# Patient Record
Sex: Male | Born: 1960 | Race: White | Hispanic: No | Smoking: Current every day smoker
Health system: Southern US, Community
[De-identification: ages and names within clinical notes are randomized; demographics above are authoritative.]

## PROBLEM LIST (undated history)

## (undated) DIAGNOSIS — R39198 Other difficulties with micturition: Secondary | ICD-10-CM

## (undated) DIAGNOSIS — F32A Depression, unspecified: Secondary | ICD-10-CM

## (undated) DIAGNOSIS — F329 Major depressive disorder, single episode, unspecified: Secondary | ICD-10-CM

## (undated) HISTORY — DX: Depression, unspecified: F32.A

## (undated) HISTORY — PX: NO PAST SURGERIES: SHX2092

## (undated) HISTORY — PX: OTHER SURGICAL HISTORY: SHX169

## (undated) HISTORY — DX: Major depressive disorder, single episode, unspecified: F32.9

---

## 2001-11-07 ENCOUNTER — Emergency Department (HOSPITAL_COMMUNITY): Admission: AC | Admit: 2001-11-07 | Discharge: 2001-11-07 | Payer: Self-pay

## 2001-11-07 ENCOUNTER — Encounter: Payer: Self-pay | Admitting: Emergency Medicine

## 2008-11-19 ENCOUNTER — Emergency Department: Payer: Self-pay | Admitting: Emergency Medicine

## 2010-05-25 ENCOUNTER — Emergency Department (HOSPITAL_COMMUNITY): Admission: EM | Admit: 2010-05-25 | Discharge: 2010-05-25 | Payer: Self-pay | Admitting: Emergency Medicine

## 2014-08-08 ENCOUNTER — Emergency Department: Payer: Self-pay | Admitting: Emergency Medicine

## 2014-08-08 LAB — URINALYSIS, COMPLETE
Bacteria: NONE SEEN
Bilirubin,UR: NEGATIVE
Blood: NEGATIVE
Glucose,UR: NEGATIVE mg/dL (ref 0–75)
Ketone: NEGATIVE
Leukocyte Esterase: NEGATIVE
Nitrite: NEGATIVE
Ph: 7 (ref 4.5–8.0)
Protein: NEGATIVE
RBC,UR: 1 /HPF (ref 0–5)
Specific Gravity: 1.021 (ref 1.003–1.030)
Squamous Epithelial: <1
WBC UR: <1 /HPF (ref 0–5)

## 2014-08-08 LAB — CBC WITH DIFFERENTIAL/PLATELET
Basophil #: 0.1 10*3/uL (ref 0.0–0.1)
Basophil %: 0.7 %
Eosinophil #: 0.1 10*3/uL (ref 0.0–0.7)
Eosinophil %: 0.7 %
HCT: 49 % (ref 40.0–52.0)
HGB: 16.1 g/dL (ref 13.0–18.0)
Lymphocyte #: 1.5 10*3/uL (ref 1.0–3.6)
Lymphocyte %: 12.1 %
MCH: 29.3 pg (ref 26.0–34.0)
MCHC: 33 g/dL (ref 32.0–36.0)
MCV: 89 fL (ref 80–100)
Monocyte #: 1 x10 3/mm (ref 0.2–1.0)
Monocyte %: 8.1 %
Neutrophil #: 9.6 10*3/uL — ABNORMAL HIGH (ref 1.4–6.5)
Neutrophil %: 78.4 %
Platelet: 170 10*3/uL (ref 150–440)
RBC: 5.51 10*6/uL (ref 4.40–5.90)
RDW: 13 % (ref 11.5–14.5)
WBC: 12.3 10*3/uL — ABNORMAL HIGH (ref 3.8–10.6)

## 2014-08-08 LAB — COMPREHENSIVE METABOLIC PANEL
Albumin: 3.8 g/dL (ref 3.4–5.0)
Alkaline Phosphatase: 72 U/L
Anion Gap: 6 — ABNORMAL LOW (ref 7–16)
BUN: 17 mg/dL (ref 7–18)
Bilirubin,Total: 0.8 mg/dL (ref 0.2–1.0)
Calcium, Total: 9.2 mg/dL (ref 8.5–10.1)
Chloride: 105 mmol/L (ref 98–107)
Co2: 25 mmol/L (ref 21–32)
Creatinine: 1.09 mg/dL (ref 0.60–1.30)
EGFR (African American): >60
EGFR (Non-African Amer.): >60
Glucose: 126 mg/dL — ABNORMAL HIGH (ref 65–99)
Osmolality: 275 (ref 275–301)
Potassium: 4.3 mmol/L (ref 3.5–5.1)
SGOT(AST): 16 U/L (ref 15–37)
SGPT (ALT): 20 U/L
Sodium: 136 mmol/L (ref 136–145)
Total Protein: 8 g/dL (ref 6.4–8.2)

## 2014-11-22 ENCOUNTER — Emergency Department: Payer: Self-pay | Admitting: Internal Medicine

## 2015-04-25 ENCOUNTER — Emergency Department
Admission: EM | Admit: 2015-04-25 | Discharge: 2015-04-25 | Disposition: A | Discharge status: Home / Self Care | Payer: Self-pay | Attending: Emergency Medicine | Admitting: Emergency Medicine

## 2015-04-25 ENCOUNTER — Encounter: Payer: Self-pay | Admitting: Emergency Medicine

## 2015-04-25 DIAGNOSIS — L03115 Cellulitis of right lower limb: Secondary | ICD-10-CM | POA: Insufficient documentation

## 2015-04-25 DIAGNOSIS — Y9289 Other specified places as the place of occurrence of the external cause: Secondary | ICD-10-CM | POA: Insufficient documentation

## 2015-04-25 DIAGNOSIS — L0390 Cellulitis, unspecified: Secondary | ICD-10-CM

## 2015-04-25 DIAGNOSIS — B880 Other acariasis: Secondary | ICD-10-CM | POA: Insufficient documentation

## 2015-04-25 DIAGNOSIS — B889 Infestation, unspecified: Secondary | ICD-10-CM

## 2015-04-25 DIAGNOSIS — Y9389 Activity, other specified: Secondary | ICD-10-CM | POA: Insufficient documentation

## 2015-04-25 DIAGNOSIS — Y998 Other external cause status: Secondary | ICD-10-CM | POA: Insufficient documentation

## 2015-04-25 DIAGNOSIS — L02415 Cutaneous abscess of right lower limb: Secondary | ICD-10-CM | POA: Insufficient documentation

## 2015-04-25 DIAGNOSIS — L0291 Cutaneous abscess, unspecified: Secondary | ICD-10-CM

## 2015-04-25 DIAGNOSIS — S70361A Insect bite (nonvenomous), right thigh, initial encounter: Secondary | ICD-10-CM | POA: Insufficient documentation

## 2015-04-25 DIAGNOSIS — W57XXXA Bitten or stung by nonvenomous insect and other nonvenomous arthropods, initial encounter: Secondary | ICD-10-CM | POA: Insufficient documentation

## 2015-04-25 MED ORDER — IBUPROFEN 800 MG PO TABS: 800.0000 mg | ORAL_TABLET | Freq: Three times a day (TID) | ORAL | Status: DC | PRN

## 2015-04-25 MED ORDER — DIPHENHYDRAMINE HCL 50 MG/ML IJ SOLN
50.0000 mg | Freq: Once | INTRAMUSCULAR | Status: AC
  Administered 2015-04-25: 50 mg via INTRAMUSCULAR

## 2015-04-25 MED ORDER — CEFTRIAXONE SODIUM 250 MG IJ SOLR
INTRAMUSCULAR | Status: AC
  Administered 2015-04-25: 500 mg via INTRAMUSCULAR
  Filled 2015-04-25: qty 500

## 2015-04-25 MED ORDER — SULFAMETHOXAZOLE-TRIMETHOPRIM 800-160 MG PO TABS: 1.0000 | ORAL_TABLET | Freq: Two times a day (BID) | ORAL | Status: DC

## 2015-04-25 MED ORDER — CEFTRIAXONE SODIUM 1 G IJ SOLR
500.0000 mg | Freq: Once | INTRAMUSCULAR | Status: AC
  Administered 2015-04-25: 500 mg via INTRAMUSCULAR

## 2015-04-25 MED ORDER — DIPHENHYDRAMINE HCL 50 MG/ML IJ SOLN
INTRAMUSCULAR | Status: AC
  Administered 2015-04-25: 50 mg via INTRAMUSCULAR
  Filled 2015-04-25: qty 1

## 2015-04-25 MED ORDER — RANITIDINE HCL 150 MG/10ML PO SYRP
150.0000 mg | ORAL_SOLUTION | Freq: Once | ORAL | Status: AC
  Administered 2015-04-25: 150 mg via ORAL
  Filled 2015-04-25: qty 10

## 2015-04-25 MED ORDER — HYDROXYZINE PAMOATE 25 MG PO CAPS: 25.0000 mg | ORAL_CAPSULE | Freq: Three times a day (TID) | ORAL | Status: DC | PRN

## 2015-04-25 MED ORDER — RANITIDINE HCL 150 MG PO TABS: 150.0000 mg | ORAL_TABLET | Freq: Two times a day (BID) | ORAL | Status: DC

## 2015-04-25 MED ORDER — HYDROCODONE-ACETAMINOPHEN 5-325 MG PO TABS: 1.0000 | ORAL_TABLET | ORAL | Status: DC | PRN

## 2015-04-25 NOTE — ED Notes (Signed)
Pt presents to ED with c/o tick bite to right thigh. Pt removed the tick last night and since then has noticed an increase in swelling, redness, and pain. Large raised, very red area noted on pt's right thigh with darker area depression in the center.

## 2015-04-25 NOTE — ED Provider Notes (Signed)
[facility] Emergency Department Provider Note  ____________________________________________  Time seen: Approximately 9:47 PM  I have reviewed the triage vital signs and the nursing notes.   HISTORY  Chief Complaint Insect Bite    HPI Eduardo Gill is a 54 y.o. male who presents to the ED with complaints of a tick bite to his right thigh. States he removed the tick last night and since isincreased swelling and redness and pain. Also complains of ticks under his skin on both his upper extremities crawling all over. Patient denies any drug or alcohol use. States girlfriend has the exact same thing in another room.   History reviewed. No pertinent past medical history.  There are no active problems to display for this patient.   History reviewed. No pertinent past surgical history.  Current Outpatient Rx  Name  Route  Sig  Dispense  Refill  . HYDROcodone-acetaminophen (NORCO) 5-325 MG per tablet   Oral   Take 1-2 tablets by mouth every 4 (four) hours as needed for moderate pain.   15 tablet   0   . hydrOXYzine (VISTARIL) 25 MG capsule   Oral   Take 1 capsule (25 mg total) by mouth 3 (three) times daily as needed.   30 capsule   0   . ibuprofen (ADVIL,MOTRIN) 800 MG tablet   Oral   Take 1 tablet (800 mg total) by mouth every 8 (eight) hours as needed.   30 tablet   0   . ranitidine (ZANTAC) 150 MG tablet   Oral   Take 1 tablet (150 mg total) by mouth 2 (two) times daily.   14 tablet   1   . sulfamethoxazole-trimethoprim (BACTRIM DS,SEPTRA DS) 800-160 MG per tablet   Oral   Take 1 tablet by mouth 2 (two) times daily.   20 tablet   0     Allergies Review of patient's allergies indicates no known allergies.  No family history on file.  Social History History  Substance Use Topics  . Smoking status: Current Every Day Smoker  . Smokeless tobacco: Not on file  . Alcohol Use: Yes    Review of Systems Constitutional: No  fever/chills Eyes: No visual changes. ENT: No sore throat. Cardiovascular: Denies chest pain. Respiratory: Denies shortness of breath. Gastrointestinal: No abdominal pain.  No nausea, no vomiting.  No diarrhea.  No constipation. Genitourinary: Negative for dysuria. Musculoskeletal: Negative for back pain. Skin: Positive for rash and excoriations both upper extremities and buttocks Neurological: Negative for headaches, focal weakness or numbness.  10-point ROS otherwise negative.  ____________________________________________   PHYSICAL EXAM:  VITAL SIGNS: ED Triage Vitals  Enc Vitals Group     BP 04/25/15 2050 132/83 mmHg     Pulse Rate 04/25/15 2050 106     Resp 04/25/15 2050 20     Temp 04/25/15 2050 98.4 F (36.9 C)     Temp Source 04/25/15 2050 Oral     SpO2 04/25/15 2050 96 %     Weight 04/25/15 2050 170 lb (77.111 kg)     Height 04/25/15 2050 6' (1.829 m)     Head Cir --      Peak Flow --      Pain Score 04/25/15 2054 6     Pain Loc --      Pain Edu? --      Excl. in [place]? --     Constitutional: Alert and oriented. Well appearing and in no acute distress. Eyes: Conjunctivae are  normal. PERRL. EOMI. Head: Atraumatic. Nose: No congestion/rhinnorhea. Mouth/Throat: Mucous membranes are moist.  Oropharynx non-erythematous. Neck: No stridor.   Cardiovascular: Normal rate, regular rhythm. Grossly normal heart sounds.  Good peripheral circulation. Respiratory: Normal respiratory effort.  No retractions. Lungs CTAB. Gastrointestinal: Soft and nontender. No distention. No abdominal bruits. No CVA tenderness. Musculoskeletal: No lower extremity tenderness nor edema.  No joint effusions. Neurologic:  Normal speech and language. No gross focal neurologic deficits are appreciated. Speech is normal. No gait instability. Skin:  Skin is warm, dry and intact. Multiple areas of excoriations noted on both upper extremities. Patient constantly picking at what appears to be moles and  freckles on his skin stating that they are baby ticks underneath the skin that is trying to get out. In addition patient has a 2 cm fluctuant erythematous lesion on his right buttocks. He reports pulling tick out of it last night constantly squeezing the trying to get any remaining ticks out. No visible insects/ticks noted on the skin. Psychiatric: Mood and affect are normal. Behavior is focused on squeezing ticks out of his arm.  ____________________________________________   LABS (all labs ordered are listed, but only abnormal results are displayed)  Labs Reviewed - No data to display ____________________________________________  EKG  Not applicable ____________________________________________  RADIOLOGY  Deferred ____________________________________________   PROCEDURES  Procedure(s) performed: None  Critical Care performed: No  ____________________________________________   INITIAL IMPRESSION / ASSESSMENT AND PLAN / ED COURSE  Pertinent labs & imaging results that were available during my care of the patient were reviewed by me and considered in my medical decision making (see chart for details).  Patient has cellulitis and an abscess to his right upper thigh. Multiple excoriations noted on both arms. Extreme pruritus. Plan is to give patient Benadryl 50 mg IM, Zantac by mouth and a prescription for Vistaril and Zantac to take at home. Started patient on MRSA precautions with Bactrim DS twice a day he is to follow-up in 48 hours here or follow up with dermatology which was provided for him. Patient voices no other emergency medical complaints at this time. He will return to the ER for worsening symptomology. ____________________________________________   FINAL CLINICAL IMPRESSION(S) / ED DIAGNOSES  Final diagnoses:  Insect bite  Chiggers (mites)  Abscess and cellulitis      Arlyss Repress, PA-C 04/25/15 2317  Delman Kitten, MD 04/26/15 [phone]

## 2015-04-25 NOTE — ED Notes (Signed)
Patient ambulatory to triage with steady gait, without difficulty or distress noted; pt reports had tick to right outer thigh that he removed; st area is not red

## 2015-04-25 NOTE — Discharge Instructions (Signed)
Abscess An abscess is an infected area that contains a collection of pus and debris.It can occur in almost any part of the body. An abscess is also known as a furuncle or boil. CAUSES  An abscess occurs when tissue gets infected. This can occur from blockage of oil or sweat glands, infection of hair follicles, or a minor injury to the skin. As the body tries to fight the infection, pus collects in the area and creates pressure under the skin. This pressure causes pain. People with weakened immune systems have difficulty fighting infections and get certain abscesses more often.  SYMPTOMS Usually an abscess develops on the skin and becomes a painful mass that is red, warm, and tender. If the abscess forms under the skin, you may feel a moveable soft area under the skin. Some abscesses break open (rupture) on their own, but most will continue to get worse without care. The infection can spread deeper into the body and eventually into the bloodstream, causing you to feel ill.  DIAGNOSIS  Your caregiver will take your medical history and perform a physical exam. A sample of fluid may also be taken from the abscess to determine what is causing your infection. TREATMENT  Your caregiver may prescribe antibiotic medicines to fight the infection. However, taking antibiotics alone usually does not cure an abscess. Your caregiver may need to make a small cut (incision) in the abscess to drain the pus. In some cases, gauze is packed into the abscess to reduce pain and to continue draining the area. HOME CARE INSTRUCTIONS   Only take over-the-counter or prescription medicines for pain, discomfort, or fever as directed by your caregiver.  If you were prescribed antibiotics, take them as directed. Finish them even if you start to feel better.  If gauze is used, follow your caregiver's directions for changing the gauze.  To avoid spreading the infection:  Keep your draining abscess covered with a  bandage.  Wash your hands well.  Do not share personal care items, towels, or whirlpools with others.  Avoid skin contact with others.  Keep your skin and clothes clean around the abscess.  Keep all follow-up appointments as directed by your caregiver. SEEK MEDICAL CARE IF:   You have increased pain, swelling, redness, fluid drainage, or bleeding.  You have muscle aches, chills, or a general ill feeling.  You have a fever. MAKE SURE YOU:   Understand these instructions.  Will watch your condition.  Will get help right away if you are not doing well or get worse. Document Released: 08/20/2005 Document Revised: 05/11/2012 Document Reviewed: 01/23/2012 [facility] Patient Information 2015 [place], [place]. This information is not intended to replace advice given to you by your health care provider. Make sure you discuss any questions you have with your health care provider.  Cellulitis Cellulitis is an infection of the skin and the tissue beneath it. The infected area is usually red and tender. Cellulitis occurs most often in the arms and lower legs.  CAUSES  Cellulitis is caused by bacteria that enter the skin through cracks or cuts in the skin. The most common types of bacteria that cause cellulitis are staphylococci and streptococci. SIGNS AND SYMPTOMS   Redness and warmth.  Swelling.  Tenderness or pain.  Fever. DIAGNOSIS  Your health care provider can usually determine what is wrong based on a physical exam. Blood tests may also be done. TREATMENT  Treatment usually involves taking an antibiotic medicine. HOME CARE INSTRUCTIONS   Take your antibiotic  medicine as directed by your health care provider. Finish the antibiotic even if you start to feel better.  Keep the infected arm or leg elevated to reduce swelling.  Apply a warm cloth to the affected area up to 4 times per day to relieve pain.  Take medicines only as directed by your health care provider.  Keep all  follow-up visits as directed by your health care provider. SEEK MEDICAL CARE IF:   You notice red streaks coming from the infected area.  Your red area gets larger or turns dark in color.  Your bone or joint underneath the infected area becomes painful after the skin has healed.  Your infection returns in the same area or another area.  You notice a swollen bump in the infected area.  You develop new symptoms.  You have a fever. SEEK IMMEDIATE MEDICAL CARE IF:   You feel very sleepy.  You develop vomiting or diarrhea.  You have a general ill feeling (malaise) with muscle aches and pains. MAKE SURE YOU:   Understand these instructions.  Will watch your condition.  Will get help right away if you are not doing well or get worse. Document Released: 08/20/2005 Document Revised: 03/27/2014 Document Reviewed: 01/26/2012 [facility] Patient Information 2015 [place], [place]. This information is not intended to replace advice given to you by your health care provider. Make sure you discuss any questions you have with your health care provider.  Insect Bite Mosquitoes, flies, fleas, bedbugs, and many other insects can bite. Insect bites are different from insect stings. A sting is when venom is injected into the skin. Some insect bites can transmit infectious diseases. SYMPTOMS  Insect bites usually turn red, swell, and itch for 2 to 4 days. They often go away on their own. TREATMENT  Your caregiver may prescribe antibiotic medicines if a bacterial infection develops in the bite. HOME CARE INSTRUCTIONS  Do not scratch the bite area.  Keep the bite area clean and dry. Wash the bite area thoroughly with soap and water.  Put ice or cool compresses on the bite area.  Put ice in a plastic bag.  Place a towel between your skin and the bag.  Leave the ice on for 20 minutes, 4 times a day for the first 2 to 3 days, or as directed.  You may apply a baking soda paste, cortisone cream,  or calamine lotion to the bite area as directed by your caregiver. This can help reduce itching and swelling.  Only take over-the-counter or prescription medicines as directed by your caregiver.  If you are given antibiotics, take them as directed. Finish them even if you start to feel better. You may need a tetanus shot if:  You cannot remember when you had your last tetanus shot.  You have never had a tetanus shot.  The injury broke your skin. If you get a tetanus shot, your arm may swell, get red, and feel warm to the touch. This is common and not a problem. If you need a tetanus shot and you choose not to have one, there is a rare chance of getting tetanus. Sickness from tetanus can be serious. SEEK IMMEDIATE MEDICAL CARE IF:   You have increased pain, redness, or swelling in the bite area.  You see a red line on the skin coming from the bite.  You have a fever.  You have joint pain.  You have a headache or neck pain.  You have unusual weakness.  You have a  rash.  You have chest pain or shortness of breath.  You have abdominal pain, nausea, or vomiting.  You feel unusually tired or sleepy. MAKE SURE YOU:   Understand these instructions.  Will watch your condition.  Will get help right away if you are not doing well or get worse. Document Released: 12/18/2004 Document Revised: 02/02/2012 Document Reviewed: 06/11/2011 [facility] Patient Information 2015 [place], [place]. This information is not intended to replace advice given to you by your health care provider. Make sure you discuss any questions you have with your health care provider.

## 2015-10-22 ENCOUNTER — Encounter: Payer: Self-pay | Admitting: Urology

## 2015-10-22 ENCOUNTER — Ambulatory Visit: Payer: Self-pay | Admitting: Urology

## 2015-10-22 ENCOUNTER — Ambulatory Visit (INDEPENDENT_AMBULATORY_CARE_PROVIDER_SITE_OTHER): Payer: Self-pay | Admitting: Urology

## 2015-10-22 VITALS — BP 104/67 | HR 81 | Ht 72.0 in | Wt 170.0 lb

## 2015-10-22 DIAGNOSIS — N401 Enlarged prostate with lower urinary tract symptoms: Secondary | ICD-10-CM

## 2015-10-22 DIAGNOSIS — K4090 Unilateral inguinal hernia, without obstruction or gangrene, not specified as recurrent: Secondary | ICD-10-CM

## 2015-10-22 DIAGNOSIS — R350 Frequency of micturition: Secondary | ICD-10-CM

## 2015-10-22 DIAGNOSIS — R3914 Feeling of incomplete bladder emptying: Secondary | ICD-10-CM

## 2015-10-22 LAB — BLADDER SCAN AMB NON-IMAGING

## 2015-10-22 MED ORDER — TAMSULOSIN HCL 0.4 MG PO CAPS: 0.4000 mg | ORAL_CAPSULE | Freq: Every day | ORAL | Status: AC

## 2015-10-22 NOTE — Progress Notes (Signed)
10/22/2015 3:33 PM   Loreli Slot 08-May-1961 [id number]  Referring provider: Dr. Tamala Julian at [facility] clinic general surgery   Chief Complaint  Patient presents with  . Urinary Frequency    New Patient    HPI: 54 yo M scheduled for left inguinal hernia repair with Dr. Tamala Julian from [facility].  He was referred for urologic workup due to some difficulty voiding after being placed on tramadol for pain control preoperatively. He reports that he had difficulty emptying his bladder, slow stream, and difficulty initiating his stream while on this medication. Once off the medications, symptoms fully resolved.  At baseline, he does report nocturia 1-2, but otherwise minimal symptoms. He has no personal history of urinary retention.  No history of urinary tract infections, bladder stones, flank pain, dysuria, or hematuria.  He is currently a prisoner.  No family history of prostate cancer. No recent PSA data available.   PMH: Past Medical History  Diagnosis Date  . Depression     Surgical History: Past Surgical History  Procedure Laterality Date  . None      Home Medications:    Medication List       This list is accurate as of: 10/22/15  3:33 PM.  Always use your most recent med list.               docusate sodium 100 MG capsule  Commonly known as:  COLACE  Take 100 mg by mouth 2 (two) times daily.     tamsulosin 0.4 MG Caps capsule  Commonly known as:  FLOMAX  Take 1 capsule (0.4 mg total) by mouth daily.     traMADol 50 MG tablet  Commonly known as:  ULTRAM  Take by mouth every 6 (six) hours as needed.     TYLENOL 8 HOUR PO  Take by mouth.        Allergies: No Known Allergies  Family History: Family History  Problem Relation Age of Onset  . Prostate cancer Neg Hx   . Bladder Cancer Neg Hx   . Kidney cancer Neg Hx     Social History:  reports that he has been smoking Cigarettes.  He has been smoking about 1.00 pack per day. He does not  have any smokeless tobacco history on file. He reports that he drinks alcohol. His drug history is not on file.  ROS: UROLOGY Frequent Urination?: No Hard to postpone urination?: No Burning/pain with urination?: No Get up at night to urinate?: Yes Leakage of urine?: No Urine stream starts and stops?: No Trouble starting stream?: Yes Do you have to strain to urinate?: No Blood in urine?: No Urinary tract infection?: No Sexually transmitted disease?: No Injury to kidneys or bladder?: No Painful intercourse?: No Weak stream?: Yes Erection problems?: No Penile pain?: No  Gastrointestinal Nausea?: No Vomiting?: No Indigestion/heartburn?: No Diarrhea?: No Constipation?: Yes  Constitutional Fever: No Night sweats?: No Weight loss?: No Fatigue?: No  Skin Skin rash/lesions?: No Itching?: No  Eyes Blurred vision?: No Double vision?: No  Ears/Nose/Throat Sore throat?: No Sinus problems?: No  Hematologic/Lymphatic Swollen glands?: No Easy bruising?: No  Cardiovascular Leg swelling?: No Chest pain?: No  Respiratory Cough?: No Shortness of breath?: No  Endocrine Excessive thirst?: No  Musculoskeletal Back pain?: No Joint pain?: No  Neurological Headaches?: No Dizziness?: No  Psychologic Depression?: No Anxiety?: No  Physical Exam: BP 104/67 mmHg  Pulse 81  Ht 6' (1.829 m)  Wt 170 lb (77.111 kg)  BMI 23.05  kg/m2  Constitutional:  Alert and oriented, No acute distress. HEENT: [place] AT, moist mucus membranes.  Trachea midline, no masses. Cardiovascular: No clubbing, cyanosis, or edema. Respiratory: Normal respiratory effort, no increased work of breathing. GI: Abdomen is soft, nontender, nondistended.  Large left inguinal hernia, reducible.   GU: No CVA tenderness. Circumcised phallus.  Large left hernia into left hemiscrotum.  Descended testicles bilaterally.   Rectal: Normal sphincter tone.  Enlarged 40+ rubbery prostate, no masses.   Skin: No  rashes, bruises or suspicious lesions. Lymph: No inguinal adenopathy. Neurologic: Grossly intact, no focal deficits, moving all 4 extremities. Psychiatric: Normal mood and affect.  Laboratory Data: Lab Results  Component Value Date   WBC 12.3* 08/08/2014   HGB 16.1 08/08/2014   HCT 49.0 08/08/2014   MCV 89 08/08/2014   PLT 170 08/08/2014    Lab Results  Component Value Date   CREATININE 1.09 08/08/2014   Urinalysis    Component Value Date/Time   COLORURINE Yellow 08/08/2014 1311   APPEARANCEUR Clear 08/08/2014 1311   LABSPEC 1.021 08/08/2014 1311   PHURINE 7.0 08/08/2014 1311   GLUCOSEU Negative 08/08/2014 1311   HGBUR Negative 08/08/2014 1311   BILIRUBINUR Negative 08/08/2014 1311   KETONESUR Negative 08/08/2014 1311   PROTEINUR Negative 08/08/2014 1311   NITRITE Negative 08/08/2014 1311   LEUKOCYTESUR Negative 08/08/2014 1311   Unable to void today for urinalysis.  Pertinent Imaging: Bladder scan 1 hour after voiding shows 200 cc in the bladder, not true post void residual.  Assessment & Plan:    1. Benign prostatic hypertrophy (BPH) with incomplete bladder emptying Prostatic enlargement noted on exam today.  Suspect his urinary symptoms were exacerbated by narcotics. Patient would likely benefit from Flomax perioperatively to reduce his risk of postoperative urinary retention.  Prescription 3 months given today. Currently asymptomatic although suspects that he does have some baseline incomplete bladder emptying.  I would like to see him back in 6 months for recheck of symptoms as well as PSA at that time (will defer PSA today and obtain records from [facility] to ensure that this lab was not inadvertently repeated) - BLADDER SCAN AMB NON-IMAGING  2. Left inguinal hernia Scheduled for left inguinal hernia repair with Dr. Tamala Julian in the near future.    Return in about 6 months (around 04/20/2016) for PSA, IPSS, PVR.  Hollice Espy, MD  [facility] [address], [place], [place] [address] [phone]

## 2015-10-22 NOTE — Addendum Note (Signed)
Addended by: Tommy Rainwater on: 10/22/2015 04:44 PM   Modules accepted: Orders

## 2015-10-29 ENCOUNTER — Other Ambulatory Visit: Payer: Self-pay

## 2015-10-29 ENCOUNTER — Encounter: Payer: Self-pay | Admitting: *Deleted

## 2015-10-29 NOTE — Patient Instructions (Signed)
  Your procedure is scheduled on: 11-06-15 (TUESDAY) Report to  [facility] To find out your arrival time please call [phone] between 1PM - 3PM on 11-05-15 [facility])  Remember: Instructions that are not followed completely may result in serious medical risk, up to and including death, or upon the discretion of your surgeon and anesthesiologist your surgery may need to be rescheduled.    _X___ 1. Do not eat food or drink liquids after midnight. No gum chewing or hard candies.     ____ 2. No Alcohol for 24 hours before or after surgery.   ____ 3. Bring all medications with you on the day of surgery if instructed.    _X___ 4. Notify your doctor if there is any change in your medical condition     (cold, fever, infections).     Do not wear jewelry, make-up, hairpins, clips or nail polish.  Do not wear lotions, powders, or perfumes. You may wear deodorant.  Do not shave 48 hours prior to surgery. Men may shave face and neck.  Do not bring valuables to the hospital.    [facility] is not responsible for any belongings or valuables.               Contacts, dentures or bridgework may not be worn into surgery.  Leave your suitcase in the car. After surgery it may be brought to your room.  For patients admitted to the hospital, discharge time is determined by your treatment team.   Patients discharged the day of surgery will not be allowed to drive home.   Please read over the following fact sheets that you were given:      _X___ Take these medicines the morning of surgery with A SIP OF WATER:    1. FLOMAX  2.   3.   4.  5.  6.  ____ Fleet Enema (as directed)   ____ Use CHG Soap as directed  ____ Use inhalers on the day of surgery  ____ Stop metformin 2 days prior to surgery    ____ Take 1/2 of usual insulin dose the night before surgery and none on the morning of surgery.   ____ Stop Coumadin/Plavix/aspirin-N/A  ____ Stop Anti-inflammatories-NO NSAIDS OR  ASPIRIN PRODUCTS   ____ Stop supplements until after surgery.    ____ Bring C-Pap to the hospital.

## 2015-11-06 ENCOUNTER — Encounter: Payer: Self-pay | Admitting: *Deleted

## 2015-11-06 ENCOUNTER — Encounter
Admission: RE | Disposition: A | Discharge status: Home / Self Care | Payer: Self-pay | Source: Ambulatory Visit | Attending: Surgery

## 2015-11-06 ENCOUNTER — Ambulatory Visit
Admission: RE | Admit: 2015-11-06 | Discharge: 2015-11-06 | Disposition: A | Discharge status: Home / Self Care | Source: Ambulatory Visit | Attending: Surgery | Admitting: Surgery

## 2015-11-06 ENCOUNTER — Ambulatory Visit: Admitting: Anesthesiology

## 2015-11-06 DIAGNOSIS — K4090 Unilateral inguinal hernia, without obstruction or gangrene, not specified as recurrent: Secondary | ICD-10-CM | POA: Insufficient documentation

## 2015-11-06 DIAGNOSIS — F17210 Nicotine dependence, cigarettes, uncomplicated: Secondary | ICD-10-CM | POA: Insufficient documentation

## 2015-11-06 DIAGNOSIS — N401 Enlarged prostate with lower urinary tract symptoms: Secondary | ICD-10-CM | POA: Insufficient documentation

## 2015-11-06 DIAGNOSIS — R39198 Other difficulties with micturition: Secondary | ICD-10-CM | POA: Insufficient documentation

## 2015-11-06 DIAGNOSIS — Z79899 Other long term (current) drug therapy: Secondary | ICD-10-CM | POA: Insufficient documentation

## 2015-11-06 DIAGNOSIS — D176 Benign lipomatous neoplasm of spermatic cord: Secondary | ICD-10-CM | POA: Insufficient documentation

## 2015-11-06 DIAGNOSIS — F329 Major depressive disorder, single episode, unspecified: Secondary | ICD-10-CM | POA: Insufficient documentation

## 2015-11-06 HISTORY — DX: Other difficulties with micturition: R39.198

## 2015-11-06 HISTORY — PX: INGUINAL HERNIA REPAIR: SHX194

## 2015-11-06 SURGERY — REPAIR, HERNIA, INGUINAL, ADULT
Anesthesia: General | Site: Abdomen | Laterality: Left | Wound class: Clean

## 2015-11-06 MED ORDER — LACTATED RINGERS IV SOLN
INTRAVENOUS | Status: DC
  Administered 2015-11-06 (×2): via INTRAVENOUS

## 2015-11-06 MED ORDER — CEFAZOLIN SODIUM-DEXTROSE 2-3 GM-% IV SOLR
INTRAVENOUS | Status: AC
  Filled 2015-11-06: qty 50

## 2015-11-06 MED ORDER — HYDROCODONE-ACETAMINOPHEN 5-325 MG PO TABS
1.0000 | ORAL_TABLET | ORAL | Status: DC | PRN
  Administered 2015-11-06: 1 via ORAL

## 2015-11-06 MED ORDER — FENTANYL CITRATE (PF) 100 MCG/2ML IJ SOLN
INTRAMUSCULAR | Status: DC | PRN
  Administered 2015-11-06 (×2): 50 ug via INTRAVENOUS

## 2015-11-06 MED ORDER — FENTANYL CITRATE (PF) 100 MCG/2ML IJ SOLN
25.0000 ug | INTRAMUSCULAR | Status: DC | PRN
  Administered 2015-11-06 (×4): 25 ug via INTRAVENOUS

## 2015-11-06 MED ORDER — LIDOCAINE HCL (CARDIAC) 20 MG/ML IV SOLN
INTRAVENOUS | Status: DC | PRN
  Administered 2015-11-06: 40 mg via INTRAVENOUS

## 2015-11-06 MED ORDER — BUPIVACAINE-EPINEPHRINE (PF) 0.5% -1:200000 IJ SOLN
INTRAMUSCULAR | Status: AC
  Filled 2015-11-06: qty 30

## 2015-11-06 MED ORDER — FAMOTIDINE 20 MG PO TABS
20.0000 mg | ORAL_TABLET | Freq: Once | ORAL | Status: AC
  Administered 2015-11-06: 20 mg via ORAL

## 2015-11-06 MED ORDER — HYDROCODONE-ACETAMINOPHEN 5-325 MG PO TABS: 1.0000 | ORAL_TABLET | Freq: Four times a day (QID) | ORAL | Status: AC | PRN

## 2015-11-06 MED ORDER — PROPOFOL 10 MG/ML IV BOLUS
INTRAVENOUS | Status: DC | PRN
  Administered 2015-11-06: 150 mg via INTRAVENOUS
  Administered 2015-11-06: 40 mg via INTRAVENOUS

## 2015-11-06 MED ORDER — EPHEDRINE SULFATE 50 MG/ML IJ SOLN
INTRAMUSCULAR | Status: DC | PRN
  Administered 2015-11-06 (×2): 10 mg via INTRAVENOUS

## 2015-11-06 MED ORDER — ONDANSETRON HCL 4 MG/2ML IJ SOLN: 4.0000 mg | Freq: Once | INTRAMUSCULAR | Status: DC | PRN

## 2015-11-06 MED ORDER — FAMOTIDINE 20 MG PO TABS
ORAL_TABLET | ORAL | Status: AC
  Filled 2015-11-06: qty 1

## 2015-11-06 MED ORDER — FENTANYL CITRATE (PF) 100 MCG/2ML IJ SOLN
INTRAMUSCULAR | Status: AC
  Administered 2015-11-06: 25 ug via INTRAVENOUS
  Filled 2015-11-06: qty 2

## 2015-11-06 MED ORDER — BUPIVACAINE-EPINEPHRINE (PF) 0.5% -1:200000 IJ SOLN
INTRAMUSCULAR | Status: DC | PRN
  Administered 2015-11-06: 15 mL

## 2015-11-06 MED ORDER — CEFAZOLIN SODIUM-DEXTROSE 2-3 GM-% IV SOLR
2.0000 g | Freq: Once | INTRAVENOUS | Status: AC
  Administered 2015-11-06: 2 g via INTRAVENOUS

## 2015-11-06 MED ORDER — MIDAZOLAM HCL 2 MG/2ML IJ SOLN
INTRAMUSCULAR | Status: DC | PRN
  Administered 2015-11-06: 1 mg via INTRAVENOUS

## 2015-11-06 MED ORDER — HYDROCODONE-ACETAMINOPHEN 5-325 MG PO TABS
ORAL_TABLET | ORAL | Status: AC
  Filled 2015-11-06: qty 1

## 2015-11-06 MED ORDER — ONDANSETRON HCL 4 MG/2ML IJ SOLN
INTRAMUSCULAR | Status: DC | PRN
  Administered 2015-11-06: 4 mg via INTRAVENOUS

## 2015-11-06 SURGICAL SUPPLY — 25 items
BLADE CLIPPER SURG (BLADE) ×3 IMPLANT
BLADE SURG 15 STRL LF DISP TIS (BLADE) ×1 IMPLANT
BLADE SURG 15 STRL SS (BLADE) ×2
CANISTER SUCT 1200ML W/VALVE (MISCELLANEOUS) ×3 IMPLANT
CHLORAPREP W/TINT 26ML (MISCELLANEOUS) ×3 IMPLANT
DRAIN PENROSE 5/8X18 LTX STRL (WOUND CARE) ×3 IMPLANT
DRAPE LAPAROTOMY 77X122 PED (DRAPES) ×3 IMPLANT
GLOVE BIO SURGEON STRL SZ7.5 (GLOVE) ×15 IMPLANT
GOWN STRL REUS W/ TWL LRG LVL3 (GOWN DISPOSABLE) ×3 IMPLANT
GOWN STRL REUS W/TWL LRG LVL3 (GOWN DISPOSABLE) ×6
KIT RM TURNOVER STRD PROC AR (KITS) ×3 IMPLANT
LABEL OR SOLS (LABEL) IMPLANT
LIQUID BAND (GAUZE/BANDAGES/DRESSINGS) ×3 IMPLANT
MESH SYNTHETIC 4X6 SOFT BARD (Mesh General) ×1 IMPLANT
MESH SYNTHETIC SOFT BARD 4X6 (Mesh General) ×2 IMPLANT
NEEDLE HYPO 25X1 1.5 SAFETY (NEEDLE) ×3 IMPLANT
NS IRRIG 500ML POUR BTL (IV SOLUTION) ×3 IMPLANT
PACK BASIN MINOR ARMC (MISCELLANEOUS) ×3 IMPLANT
PAD GROUND ADULT SPLIT (MISCELLANEOUS) ×3 IMPLANT
SUT CHROMIC 4 0 RB 1X27 (SUTURE) ×3 IMPLANT
SUT MNCRL AB 4-0 PS2 18 (SUTURE) ×3 IMPLANT
SUT SURGILON 0 30 BLK (SUTURE) ×6 IMPLANT
SUT VIC AB 4-0 SH 27 (SUTURE) ×2
SUT VIC AB 4-0 SH 27XANBCTRL (SUTURE) ×1 IMPLANT
SYRINGE 10CC LL (SYRINGE) ×3 IMPLANT

## 2015-11-06 NOTE — Discharge Instructions (Signed)
Take acetaminophen 325 mg  2 by mouth each 6 hours as needed for minor pain.  Take hydrocodone with acetaminophen 1 by mouth each 6 hours as needed for moderate pain.  May shower.  Avoid straining and heavy lifting for 1 month.   General Anesthesia, Adult, Care After Refer to this sheet in the next few weeks. These instructions provide you with information on caring for yourself after your procedure. Your health care provider may also give you more specific instructions. Your treatment has been planned according to current medical practices, but problems sometimes occur. Call your health care provider if you have any problems or questions after your procedure. WHAT TO EXPECT AFTER THE PROCEDURE After the procedure, it is typical to experience:  Sleepiness.  Nausea and vomiting. HOME CARE INSTRUCTIONS  For the first 24 hours after general anesthesia:  Have a responsible person with you.  Do not drive a car. If you are alone, do not take public transportation.  Do not drink alcohol.  Do not take medicine that has not been prescribed by your health care provider.  Do not sign important papers or make important decisions.  You may resume a normal diet and activities as directed by your health care provider.  Change bandages (dressings) as directed.  If you have questions or problems that seem related to general anesthesia, call the hospital and ask for the anesthetist or anesthesiologist on call. SEEK MEDICAL CARE IF:  You have nausea and vomiting that continue the day after anesthesia.  You develop a rash. SEEK IMMEDIATE MEDICAL CARE IF:   You have difficulty breathing.  You have chest pain.  You have any allergic problems.   This information is not intended to replace advice given to you by your health care provider. Make sure you discuss any questions you have with your health care provider.   Document Released: 02/16/2001 Document Revised: 12/01/2014 Document  Reviewed: 03/10/2012 Elsevier Interactive Patient Education [employer].

## 2015-11-06 NOTE — OR Nursing (Signed)
Dr Nicholes Stairs in to see pt.

## 2015-11-06 NOTE — Op Note (Signed)
OPERATIVE REPORT  PREOPERATIVE DIAGNOSIS: left inguinal hernia  POSTOPERATIVE DIAGNOSIS:left  inguinal hernia  PROCEDURE:  left inguinal hernia repair  ANESTHESIA:  General  SURGEON:  Rochel Brome M.D.  ASSISTANT : Olena Leatherwood RN  INDICATIONS: He has a history of bulging in the left groin with pain. A left inguinal hernia was demonstrated on physical exam.  With the patient on the operating table in the supine position the left lower quadrant was prepared with clippers and with ChloraPrep and draped in a sterile manner. A transversely oriented suprapubic incision was made and carried down through subcutaneous tissues. Electrocautery was used for hemostasis. The Scarpa's fascia was incised. The external oblique aponeurosis was incised along the course of its fibers to open the external ring and expose the inguinal cord structures. The cord structures were mobilized. A Penrose drain was passed around the cord structures for traction. Cremaster fibers were separated to expose an indirect hernia sac which was dissected free from surrounding structures and followed up to the internal ring. The sac was 8 cm in length. It contained omentum which was reduced into the peritoneal cavity. A high ligation of the sac was done with a 4-0 Vicryl suture ligature. The sac was excised and was not sent for pathology. A cord lipoma was dissected free from surrounding structures and a high ligation was done with 4-0 Vicryl suture ligature and excised the lipoma and was not sent for pathology. The repair was done with 0 Surgilon sutures suturing the conjoined tendon to the shelving edge of the inguinal ligament incorporating transversalis fascia into the repair. A relaxing incision was made medially  Bard soft mesh was cut to create an oval shape and was placed over the repair. This was sutured to the repair with interrupted 0 Surgilon sutures and also sutured medially to the deep fascia and on both sides of the  internal ring. Next after seeing hemostasis was intact the cord structures were replaced along the floor of the inguinal canal. The cut edges of the external oblique aponeurosis were closed with a running 4-0 Vicryl suture to re-create the external ring. The deep fascia superior and lateral to the repair site was infiltrated with half percent Sensorcaine with epinephrine. Subcutaneous tissues were also infiltrated. The Scarpa's fascia was closed with interrupted 4-0 Vicryl sutures. The skin was closed with running 4-0 Monocryl subcuticular suture and LiquiBand. The testicle remained in the scrotum  The patient appeared to be in satisfactory condition and was prepared for transfer to the recovery room.  Rochel Brome M.D.

## 2015-11-06 NOTE — Anesthesia Postprocedure Evaluation (Signed)
Anesthesia Post Note  Patient: Eduardo Gill  Procedure(s) Performed: Procedure(s) (LRB): HERNIA REPAIR INGUINAL ADULT (Left)  Patient location during evaluation: Other Anesthesia Type: General Level of consciousness: awake Pain management: pain level controlled Vital Signs Assessment: post-procedure vital signs reviewed and stable Respiratory status: spontaneous breathing Cardiovascular status: blood pressure returned to baseline Anesthetic complications: no    Last Vitals:  Filed Vitals:   11/06/15 1054 11/06/15 1129  BP: 118/71 114/78  Pulse:  63  Temp:    Resp: 16 16    Last Pain:  Filed Vitals:   11/06/15 1131  PainSc: [place]

## 2015-11-06 NOTE — Transfer of Care (Signed)
Immediate Anesthesia Transfer of Care Note  Patient: Eduardo Gill  Procedure(s) Performed: Procedure(s): HERNIA REPAIR INGUINAL ADULT (Left)  Patient Location: PACU  Anesthesia Type:General  Level of Consciousness: sedated  Airway & Oxygen Therapy: Patient Spontanous Breathing and Patient connected to face mask oxygen  Post-op Assessment: Report given to RN and Post -op Vital signs reviewed and stable  Post vital signs: Reviewed and stable  Last Vitals:  Filed Vitals:   11/06/15 0602 11/06/15 0930  BP: 99/64 118/66  Pulse: 60 63  Temp: 35.9 C 36.7 C  Resp: 16 16    Complications: No apparent anesthesia complications

## 2015-11-06 NOTE — H&P (Signed)
Eduardo Gill is an 54 y.o. male.   Chief Complaint: Bulging in the left groin HPI: He reports a history of bulging in the left groin with moderate pain  Past Medical History  Diagnosis Date  . Depression   . Difficulty urinating    prostatism. He recently had urology consultation and was started on tamsulosin  Past Surgical History  Procedure Laterality Date  . None    . No past surgeries      Family History  Problem Relation Age of Onset  . Prostate cancer Neg Hx   . Bladder Cancer Neg Hx   . Kidney cancer Neg Hx    Social History:  reports that he has been smoking Cigarettes.  He has been smoking about 1.00 pack per day. He does not have any smokeless tobacco history on file. He reports that he drinks alcohol. His drug history is not on file.  Allergies: No Known Allergies  Medications Prior to Admission  Medication Sig Dispense Refill  . docusate sodium (COLACE) 100 MG capsule Take 100 mg by mouth 2 (two) times daily.    . tamsulosin (FLOMAX) 0.4 MG CAPS capsule Take 1 capsule (0.4 mg total) by mouth daily. (Patient taking differently: Take 0.4 mg by mouth every morning. ) 30 capsule 2    No results found for this or any previous visit (from the past 48 hour(s)). No results found.  ROS he reports no other recent acute illness, no difficulty breathing  Blood pressure 99/64, pulse 60, temperature 96.7 F (35.9 C), temperature source Tympanic, resp. rate 16, height 6' (1.829 m), weight 77.111 kg (170 lb), SpO2 100 %. Physical Exam  GENERAL:  Awake alert and oriented and in no acute distress.  HEENT:  Head is normocephalic.  Pupils are equal reactive to light.  Extraocular movements are intact. Sclera is clear.  Pharynx is clear.  LUNGS:  Clear without rales rhonchi or wheezes.  HEART:  Regular rhythm S1-S2, without murmur.  Abdomen: Flat and nontender. There is a bulge in the left groin consistent with left inguinal hernia.  Neurologic: Awake alert and  oriented Assessment/Plan Left inguinal hernia. I discussed plan for left inguinal hernia repair  Rochel Brome 11/06/2015, 7:32 AM

## 2015-11-06 NOTE — Anesthesia Preprocedure Evaluation (Signed)
Anesthesia Evaluation  Patient identified by MRN, date of birth, ID band Patient awake    Reviewed: Allergy & Precautions, NPO status , Patient's Chart, lab work & pertinent test results, reviewed documented beta blocker date and time   Airway Mallampati: II  TM Distance: >3 FB     Dental  (+) Chipped   Pulmonary Current Smoker,           Cardiovascular      Neuro/Psych PSYCHIATRIC DISORDERS Depression    GI/Hepatic   Endo/Other    Renal/GU      Musculoskeletal   Abdominal   Peds  Hematology   Anesthesia Other Findings   Reproductive/Obstetrics                             Anesthesia Physical Anesthesia Plan  ASA: II  Anesthesia Plan: General   Post-op Pain Management:    Induction: Intravenous  Airway Management Planned: LMA  Additional Equipment:   Intra-op Plan:   Post-operative Plan:   Informed Consent: I have reviewed the patients History and Physical, chart, labs and discussed the procedure including the risks, benefits and alternatives for the proposed anesthesia with the patient or authorized representative who has indicated his/her understanding and acceptance.     Plan Discussed with: CRNA  Anesthesia Plan Comments:         Anesthesia Quick Evaluation

## 2019-07-05 ENCOUNTER — Emergency Department
Admission: EM | Admit: 2019-07-05 | Discharge: 2019-07-06 | Disposition: A | Discharge status: Home / Self Care | Payer: Self-pay | Attending: Emergency Medicine | Admitting: Emergency Medicine

## 2019-07-05 ENCOUNTER — Other Ambulatory Visit: Payer: Self-pay

## 2019-07-05 ENCOUNTER — Emergency Department: Payer: Self-pay

## 2019-07-05 ENCOUNTER — Encounter: Payer: Self-pay | Admitting: Emergency Medicine

## 2019-07-05 DIAGNOSIS — Z20828 Contact with and (suspected) exposure to other viral communicable diseases: Secondary | ICD-10-CM | POA: Insufficient documentation

## 2019-07-05 DIAGNOSIS — F17210 Nicotine dependence, cigarettes, uncomplicated: Secondary | ICD-10-CM | POA: Insufficient documentation

## 2019-07-05 DIAGNOSIS — R079 Chest pain, unspecified: Secondary | ICD-10-CM | POA: Insufficient documentation

## 2019-07-05 DIAGNOSIS — Z79899 Other long term (current) drug therapy: Secondary | ICD-10-CM | POA: Insufficient documentation

## 2019-07-05 LAB — CBC
HCT: 45 % (ref 39.0–52.0)
Hemoglobin: 14.8 g/dL (ref 13.0–17.0)
MCH: 29 pg (ref 26.0–34.0)
MCHC: 32.9 g/dL (ref 30.0–36.0)
MCV: 88.2 fL (ref 80.0–100.0)
Platelets: 166 10*3/uL (ref 150–400)
RBC: 5.1 MIL/uL (ref 4.22–5.81)
RDW: 12.7 % (ref 11.5–15.5)
WBC: 10.7 10*3/uL — ABNORMAL HIGH (ref 4.0–10.5)
nRBC: 0 % (ref 0.0–0.2)

## 2019-07-05 LAB — TROPONIN I (HIGH SENSITIVITY)
Troponin I (High Sensitivity): <2 ng/L (ref <18)
Troponin I (High Sensitivity): <2 ng/L (ref <18)

## 2019-07-05 LAB — SARS CORONAVIRUS 2 BY RT PCR (HOSPITAL ORDER, PERFORMED IN ~~LOC~~ HOSPITAL LAB): SARS Coronavirus 2: NEGATIVE

## 2019-07-05 LAB — BASIC METABOLIC PANEL
Anion gap: 9 (ref 5–15)
BUN: 17 mg/dL (ref 6–20)
CO2: 24 mmol/L (ref 22–32)
Calcium: 9 mg/dL (ref 8.9–10.3)
Chloride: 102 mmol/L (ref 98–111)
Creatinine, Ser: 1.31 mg/dL — ABNORMAL HIGH (ref 0.61–1.24)
GFR calc Af Amer: >60 mL/min (ref >60)
GFR calc non Af Amer: >60 mL/min (ref >60)
Glucose, Bld: 102 mg/dL — ABNORMAL HIGH (ref 70–99)
Potassium: 4.2 mmol/L (ref 3.5–5.1)
Sodium: 135 mmol/L (ref 135–145)

## 2019-07-05 MED ORDER — KETOROLAC TROMETHAMINE 30 MG/ML IJ SOLN
30.0000 mg | Freq: Once | INTRAMUSCULAR | Status: AC
  Administered 2019-07-05: 30 mg via INTRAVENOUS
  Filled 2019-07-05: qty 1

## 2019-07-05 MED ORDER — ONDANSETRON HCL 4 MG/2ML IJ SOLN
INTRAMUSCULAR | Status: AC
  Administered 2019-07-05: 4 mg via INTRAVENOUS
  Filled 2019-07-05: qty 2

## 2019-07-05 MED ORDER — ONDANSETRON HCL 4 MG/2ML IJ SOLN
4.0000 mg | Freq: Once | INTRAMUSCULAR | Status: AC
  Administered 2019-07-05: 4 mg via INTRAVENOUS

## 2019-07-05 MED ORDER — SODIUM CHLORIDE 0.9% FLUSH: 3.0000 mL | Freq: Once | INTRAVENOUS | Status: DC

## 2019-07-05 MED ORDER — IOHEXOL 350 MG/ML SOLN
75.0000 mL | Freq: Once | INTRAVENOUS | Status: AC | PRN
  Administered 2019-07-05: 75 mL via INTRAVENOUS

## 2019-07-05 MED ORDER — MORPHINE SULFATE (PF) 4 MG/ML IV SOLN
4.0000 mg | Freq: Once | INTRAVENOUS | Status: AC
  Administered 2019-07-05: 23:00:00 4 mg via INTRAVENOUS

## 2019-07-05 MED ORDER — MORPHINE SULFATE (PF) 4 MG/ML IV SOLN
INTRAVENOUS | Status: AC
  Administered 2019-07-05: 4 mg via INTRAVENOUS
  Filled 2019-07-05: qty 1

## 2019-07-05 NOTE — ED Notes (Signed)
Pt back from CT c/o worsening CP. Pt visibly rolling in bed hunched over stating he is in pain. Pt st it hurts when he takes a breath and it subsides when he stops breathing. MD made aware of pts pain

## 2019-07-05 NOTE — ED Notes (Signed)
Patient transported to CT 

## 2019-07-05 NOTE — ED Triage Notes (Signed)
Pt to ER with c/o chest pain that started last night and got significantly worse in the last 2 hours.  Pt states SHOB.

## 2019-07-05 NOTE — ED Provider Notes (Addendum)
[facility] Emergency Department Provider Note   ____________________________________________   First MD Initiated Contact with Patient 07/05/19 2150     (approximate)  I have reviewed the triage vital signs and the nursing notes.   HISTORY  Chief Complaint Chest Pain    HPI Eduardo Gill is a 58 y.o. male who comes in complaining of pleuritic chest pain and shortness of breath.  It started last night but got worse today.  He is got a little bit of a dry cough but not much and no fever.  Pain is moderately severe again worse with deep breathing but not significant if he takes small breaths.  He has no COVID exposure         Past Medical History:  Diagnosis Date  . Depression   . Difficulty urinating     There are no active problems to display for this patient.   Past Surgical History:  Procedure Laterality Date  . INGUINAL HERNIA REPAIR Left 11/06/2015   Procedure: HERNIA REPAIR INGUINAL ADULT;  Surgeon: Leonie Green, MD;  Location: [facility] ORS;  Service: General;  Laterality: Left;  . NO PAST SURGERIES    . none      Prior to Admission medications   Medication Sig Start Date End Date Taking? Authorizing Provider  docusate sodium (COLACE) 100 MG capsule Take 100 mg by mouth 2 (two) times daily.    [provider]  HYDROcodone-acetaminophen (NORCO) 5-325 MG tablet Take 1 tablet by mouth every 6 (six) hours as needed for moderate pain. 11/06/15   Leonie Green, MD  tamsulosin (FLOMAX) 0.4 MG CAPS capsule Take 1 capsule (0.4 mg total) by mouth daily. Patient taking differently: Take 0.4 mg by mouth every morning.  10/22/15   Hollice Espy, MD    Allergies Patient has no known allergies.  Family History  Problem Relation Age of Onset  . Prostate cancer Neg Hx   . Bladder Cancer Neg Hx   . Kidney cancer Neg Hx     Social History Social History   Tobacco Use  . Smoking status: Current Every Day Smoker   Packs/day: 1.00    Types: Cigarettes  . Smokeless tobacco: Never Used  Substance Use Topics  . Alcohol use: Yes    Alcohol/week: 0.0 standard drinks  . Drug use: Not on file    Review of Systems  Constitutional: No fever/chills Eyes: No visual changes. ENT: No sore throat. Cardiovascular:  chest pain. Respiratory:  shortness of breath. Gastrointestinal: No abdominal pain.  No nausea, no vomiting.  No diarrhea.  No constipation. Genitourinary: Negative for dysuria. Musculoskeletal: Negative for back pain. Skin: Negative for rash. Neurological: Negative for headaches, focal weakness   ____________________________________________   PHYSICAL EXAM:  VITAL SIGNS: ED Triage Vitals  Enc Vitals Group     BP 07/05/19 1825 134/75     Pulse Rate 07/05/19 1825 97     Resp 07/05/19 1825 (!) 24     Temp 07/05/19 1827 98.9 F (37.2 C)     Temp src --      SpO2 07/05/19 1825 98 %     Weight 07/05/19 1823 175 lb (79.4 kg)     Height 07/05/19 1823 6' (1.829 m)     Head Circumference --      Peak Flow --      Pain Score 07/05/19 1823 8     Pain Loc --      Pain Edu? --  Excl. in [place]? --     Constitutional: Alert and oriented. Well appearing and in no acute distress. Eyes: Conjunctivae are normal. Head: Atraumatic. Nose: No congestion/rhinnorhea. Mouth/Throat: Mucous membranes are moist.  Oropharynx non-erythematous. Neck: No stridor.  Cardiovascular: Normal rate, regular rhythm. Grossly normal heart sounds.  Good peripheral circulation. Respiratory: Normal respiratory effort.  No retractions. Lungs CTAB. Gastrointestinal: Soft and nontender. No distention. No abdominal bruits. No CVA tenderness. Musculoskeletal: No lower extremity tenderness nor edema.   Neurologic:  Normal speech and language. No gross focal neurologic deficits are appreciated. Skin:  Skin is warm, dry and intact. No rash noted.   ____________________________________________   LABS (all labs ordered  are listed, but only abnormal results are displayed)  Labs Reviewed  BASIC METABOLIC PANEL - Abnormal; Notable for the following components:      Result Value   Glucose, Bld 102 (*)    Creatinine, Ser 1.31 (*)    All other components within normal limits  CBC - Abnormal; Notable for the following components:   WBC 10.7 (*)    All other components within normal limits  SARS CORONAVIRUS 2 (HOSPITAL ORDER, [place] [facility])  TROPONIN I (HIGH SENSITIVITY)  TROPONIN I (HIGH SENSITIVITY)   ____________________________________________  EKG  EKG read and interpreted by me shows sinus tachycardia rate of 101 normal axis no acute changes ____________________________________________  RADIOLOGY  ED MD interpretation: Chest x-ray read by radiology reviewed by me shows no obvious acute disease  Official radiology report(s): Dg Chest 2 View  Result Date: 07/05/2019 CLINICAL DATA:  Chest pain EXAM: CHEST - 2 VIEW COMPARISON:  None. FINDINGS: The heart size and mediastinal contours are within normal limits. Both lungs are clear. The visualized skeletal structures are unremarkable. IMPRESSION: No active cardiopulmonary disease. Electronically Signed   By: Inez Catalina M.D.   On: 07/05/2019 19:07   Ct Angio Chest Pe W And/or Wo Contrast  Result Date: 07/05/2019 CLINICAL DATA:  Shortness of breath EXAM: CT ANGIOGRAPHY CHEST WITH CONTRAST TECHNIQUE: Multidetector CT imaging of the chest was performed using the standard protocol during bolus administration of intravenous contrast. Multiplanar CT image reconstructions and MIPs were obtained to evaluate the vascular anatomy. CONTRAST:  14mL OMNIPAQUE IOHEXOL 350 MG/ML SOLN COMPARISON:  None. FINDINGS: Cardiovascular: There is a optimal opacification of the pulmonary arteries. There is no central,segmental, or subsegmental filling defects within the pulmonary arteries. The heart is normal in size. No evidence of right heart strain.  Coronary artery calcifications are seen. 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 There is also a burr origin of the left common carotid artery off of the brachiocephalic trunk. No stenosis is noted. The thoracic aorta is normal in appearance. Mediastinum/Nodes: No hilar, mediastinal, or axillary adenopathy. Thyroid gland, trachea, and esophagus demonstrate no significant findings. Lungs/Pleura: Subpleural blebs are seen at the lung apices. There is a 5 mm pulmonary nodule in the posterior right lower lobe on series 6, image 65. Linear atelectasis or scarring seen at both lung bases. The lungs are otherwise clear. No pleural effusion. Upper Abdomen: A small hiatal hernia is present. The upper abdomen is otherwise unremarkable. Musculoskeletal: No chest wall abnormality. No acute or significant osseous findings. Review of the MIP images confirms the above findings. IMPRESSION: 1. No central, segmental, or subsegmental pulmonary embolism. 2. 5 mm pulmonary nodule within the posterior right lung base. No follow-up needed if patient is low-risk. Non-contrast chest CT can be considered  in 12 months if patient is high-risk. This recommendation follows the consensus statement: Guidelines for Management of Incidental Pulmonary Nodules Detected on CT Images: From the Fleischner Society 2017; Radiology 2017; 284:228-243. 3. No acute intrathoracic pathology. 4. 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 Electronically Signed   By: Prudencio Pair M.D.   On: 07/05/2019 23:10    ____________________________________________   PROCEDURES  Procedure(s) performed (including Critical Care):  Procedures   ____________________________________________   INITIAL IMPRESSION / ASSESSMENT AND PLAN / ED COURSE  Patient signed out to Dr. Beather Arbour pending COVID test results.  The rest of his tests are negative.  I do not believe this is cardiac.  There  is no evidence of PE or pneumonia or lung collapse.  We will likely treat the patient for pain and let him follow-up with his doctor.          ____________________________________________   FINAL CLINICAL IMPRESSION(S) / ED DIAGNOSES  Final diagnoses:  Chest pain, unspecified type     ED Discharge Orders    None       Note:  This document was prepared using Dragon voice recognition software and may include unintentional dictation errors.    Nena Polio, MD 07/05/19 8675    Nena Polio, MD 07/05/19 [phone]

## 2019-07-05 NOTE — ED Notes (Signed)
Pt returned from CT very uncomfortable. Pt is red in the face and grimacing. Pt sitting straight up and holding his chest. MD made aware.

## 2019-07-05 NOTE — Discharge Instructions (Addendum)
I am not sure what is causing the chest pain.  All the tests are negative.  There is no sign of any heart injury, the chest x-ray, EKG and CT of the chest do not show anything but a small pulmonary nodule.  This nodule does not look like it is cancerous. it will need to be followed up in about 6 months though.  It is unlikely that it is causing the pain.  Please take Motrin 3 of the over-the-counter pills 3 times a day for the next couple days.  Please follow-up with your regular doctor tomorrow for recheck please return here for worsening pain fever shortness of breath or any other problems. You can follow-up with her [facility].  They are right next to the hospital.  Please make sure that they understand that you were seen in the ER today and fully evaluated.

## 2019-07-06 MED ORDER — LIDOCAINE VISCOUS HCL 2 % MT SOLN
15.0000 mL | Freq: Once | OROMUCOSAL | Status: AC
  Administered 2019-07-06: 15 mL via ORAL
  Filled 2019-07-06: qty 15

## 2019-07-06 MED ORDER — ALUM & MAG HYDROXIDE-SIMETH 200-200-20 MG/5ML PO SUSP
30.0000 mL | Freq: Once | ORAL | Status: AC
  Administered 2019-07-06: 30 mL via ORAL
  Filled 2019-07-06: qty 30

## 2019-07-06 MED ORDER — DICYCLOMINE HCL 10 MG/5ML PO SOLN
10.0000 mg | Freq: Once | ORAL | Status: AC
  Administered 2019-07-06: 10 mg via ORAL
  Filled 2019-07-06 (×2): qty 5

## 2019-12-23 IMAGING — CR CHEST - 2 VIEW
2 series · 2 of 2 positions shown · non-contrast
Comparison: None.

CLINICAL DATA: Chest pain

EXAM:
CHEST - 2 VIEW

[chest pa]
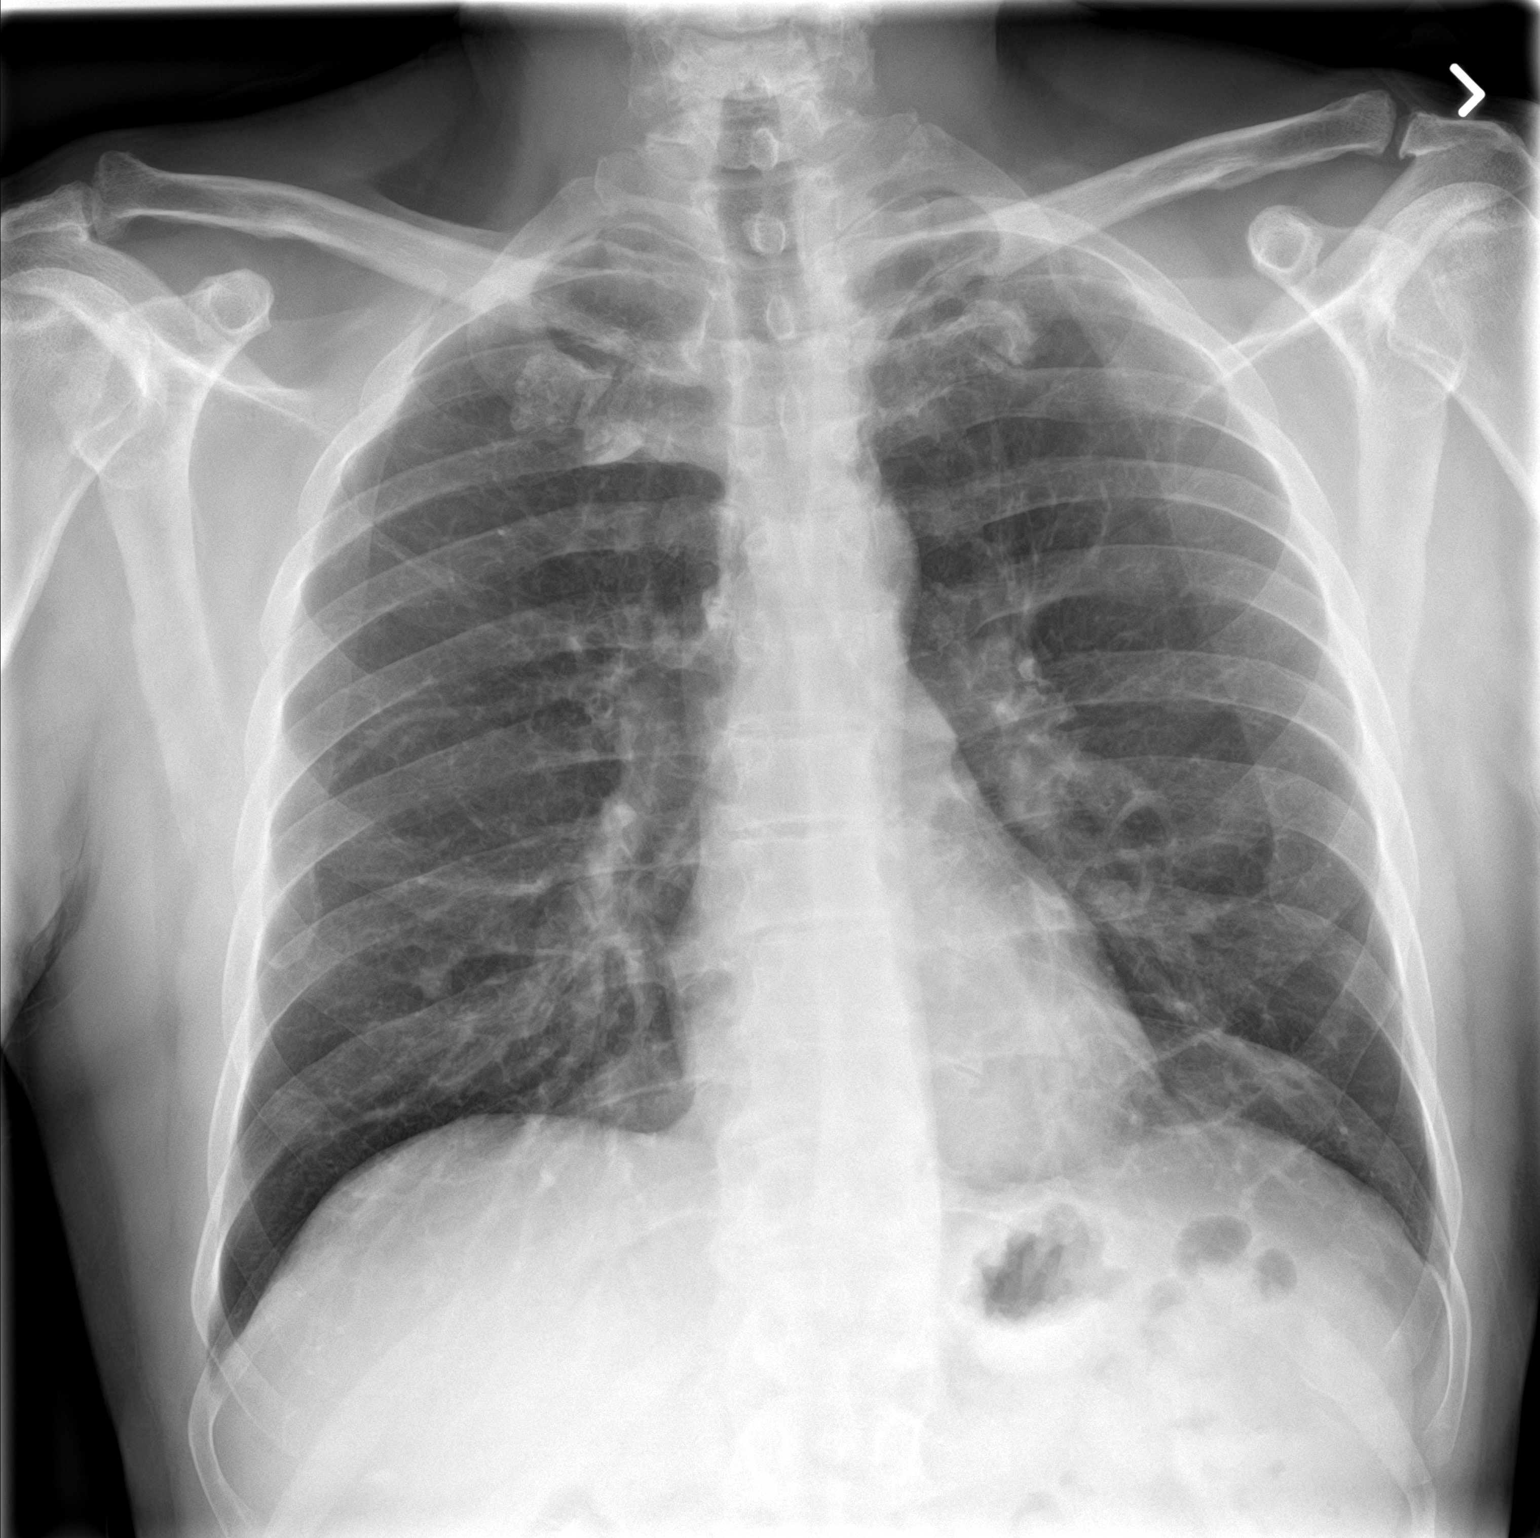

[chest lat]
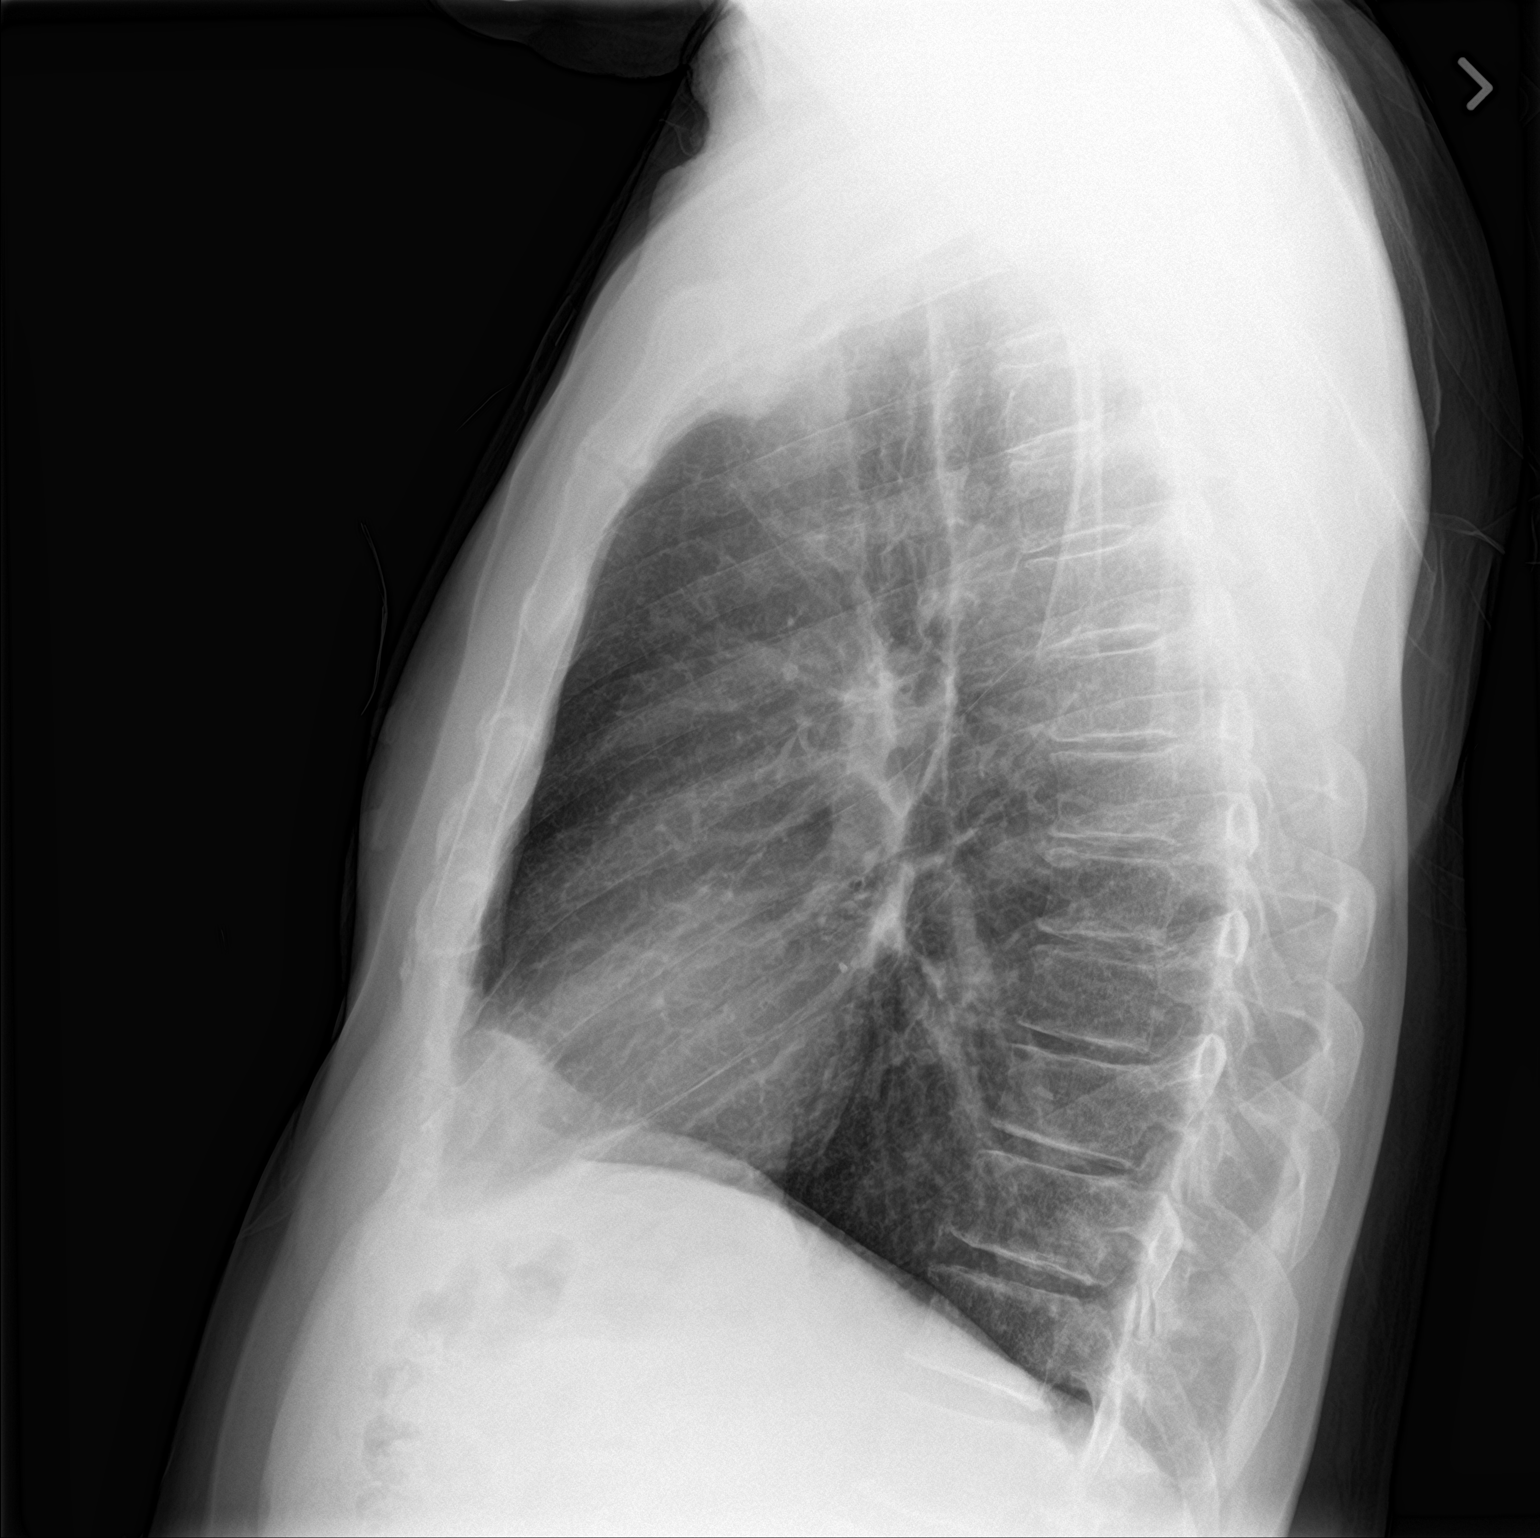

[2 of 2 positions shown; findings below may reference images not displayed]

FINDINGS: The heart size and mediastinal contours are within normal limits.
Both lungs are clear. The visualized skeletal structures are
unremarkable.
IMPRESSION: No active cardiopulmonary disease.

## 2024-05-01 ENCOUNTER — Other Ambulatory Visit: Payer: Self-pay

## 2024-05-01 ENCOUNTER — Emergency Department: Payer: Self-pay

## 2024-05-01 ENCOUNTER — Emergency Department
Admission: EM | Admit: 2024-05-01 | Discharge: 2024-05-01 | Disposition: A | Discharge status: Home / Self Care | Payer: Self-pay

## 2024-05-01 ENCOUNTER — Encounter: Payer: Self-pay | Admitting: Pharmacy Technician

## 2024-05-01 DIAGNOSIS — M545 Low back pain, unspecified: Secondary | ICD-10-CM | POA: Insufficient documentation

## 2024-05-01 DIAGNOSIS — M542 Cervicalgia: Secondary | ICD-10-CM | POA: Diagnosis not present

## 2024-05-01 DIAGNOSIS — Y92524 Gas station as the place of occurrence of the external cause: Secondary | ICD-10-CM | POA: Diagnosis not present

## 2024-05-01 DIAGNOSIS — S161XXA Strain of muscle, fascia and tendon at neck level, initial encounter: Secondary | ICD-10-CM | POA: Diagnosis not present

## 2024-05-01 DIAGNOSIS — W19XXXA Unspecified fall, initial encounter: Secondary | ICD-10-CM

## 2024-05-01 DIAGNOSIS — W010XXA Fall on same level from slipping, tripping and stumbling without subsequent striking against object, initial encounter: Secondary | ICD-10-CM | POA: Insufficient documentation

## 2024-05-01 MED ORDER — LIDOCAINE 5 % EX PTCH: 1.0000 | MEDICATED_PATCH | CUTANEOUS | 0 refills | Status: AC

## 2024-05-01 MED ORDER — ACETAMINOPHEN 500 MG PO TABS
1000.0000 mg | ORAL_TABLET | Freq: Once | ORAL | Status: DC
  Filled 2024-05-01: qty 2

## 2024-05-01 NOTE — ED Provider Notes (Signed)
 [facility] Provider Note    Event Date/Time   First MD Initiated Contact with Patient 05/01/24 [phone]     (approximate)   History   Fall  Pt bib ems from gas station after slipping on a puddle. Pt fell onto his left side. Complaining of pain right above R hip and neck pain. Pt did not hit head, no LOC. Not on anticoags.    HPI Eduardo Gill is a 63 y.o. male presents for evaluation after fall - Patient was in a gas station, reportedly slipped on wet ground, appears he both landed on his right shoulder and attempted to break fall with his left hand.  Complained of immediate neck pain.  Unclear if he hit his head.  No LOC.  No preceding symptoms.  Otherwise has been in his usual state of health.  Did take a half dose of his Subutex prior to arrival for pain.      Physical Exam   Triage Vital Signs: BP 116/68   Pulse (!) 53   Temp 98 F (36.7 C)   Resp 16   SpO2 98%    Most recent vital signs: Vitals:   05/01/24 1048 05/01/24 1049  BP: 116/68   Pulse:  (!) 53  Resp:    Temp:    SpO2:  98%     General: Awake, no distress.  HEENT: Atraumatic, normocephalic, some tenderness to palpation over occiput though no obvious hematoma or deformity appreciated Neck: Right paraspinal as well as right central upper C-spine pain.  C-collar in place. CV:  Good peripheral perfusion. RRR, RP 2+ Resp:  Normal effort. CTAB Abd:  No distention. Nontender to deep palpation throughout Back:   R paraspinal pain and TTP over iliac crest, +mild mildline L Spine pain Other:  Pelvis stable, full range of motion of all joints of bilateral upper and lower extremities with no gross deformities appreciated.  Does note some pain with ranging of left wrist, no snuffbox tenderness   ED Results / Procedures / Treatments   Labs (all labs ordered are listed, but only abnormal results are displayed) Labs Reviewed - No data to  display   EKG  N/a   RADIOLOGY Radiology interpreted by myself and radiology reports reviewed.  No acute findings.    PROCEDURES:  Critical Care performed: No  Procedures   MEDICATIONS ORDERED IN ED: Medications  acetaminophen  (TYLENOL ) tablet 1,000 mg (1,000 mg Oral Not Given 05/01/24 0914)     IMPRESSION / MDM / ASSESSMENT AND PLAN / ED COURSE  I reviewed the triage vital signs and the nursing notes.                              DDX/MDM/AP: Differential diagnosis includes, but is not limited to, mechanical fall, consider C-spine fracture, skull fracture, intracranial hemorrhage.  Consider L-spine fracture.  Doubt pelvic fracture/hip fracture.  Consider left wrist fracture though suspect sprain.  Plan: - CT head, CT C-spine, L-spine - Maintain c-collar for now - Tylenol , declines need for additional pain control beyond this   Patient's presentation is most consistent with acute presentation with potential threat to life or bodily function.  ED course below.  Workup unremarkable, no traumatic findings.  C-spine cleared clinically.  No snuffbox tenderness.  Recommend Tylenol , Motrin , also Rx Lidoderm  patches.  Plan for PMD follow-up.  ED return precautions in place.  Patient agrees with plan.  Clinical Course as  of 05/01/24 1111  Sun May 01, 2024  0920 XR pelvis: IMPRESSION: 1. No acute fracture or dislocation. 2. Mild bilateral hip arthritic changes.   [MM]  0920 XR L wrist: IMPRESSION: Negative.   [MM]  0950 CT L spine: IMPRESSION: 1. No fracture or dislocation of the lumbar spine. 2. Minimal multilevel lumbar endplate osteophytosis with preserved disc space height. Mild facet degenerative change of the lower lumbar levels.   [MM]  0950 CTH, CTCspine: IMPRESSION: 1. No acute intracranial pathology. 2. No fracture or static subluxation of the cervical spine. 3. Focally moderate disc space height loss and osteophytosis at C6-C7 with otherwise mild  disc degenerative change.   [MM]  1101 Patient reevaluated, discussed reassuring findings.  C-collar removed, C-spine cleared clinically.  Rx Tylenol , Motrin , Lidoderm  patches.  Plan for PMD follow-up.  ED return precautions in place.  Patient agrees with plan. [MM]    Clinical Course User Index [MM] Collis Deaner, MD     FINAL CLINICAL IMPRESSION(S) / ED DIAGNOSES   Final diagnoses:  Fall, initial encounter  Strain of neck muscle, initial encounter  Acute right-sided low back pain without sciatica     Rx / DC Orders   ED Discharge Orders          Ordered    lidocaine  (LIDODERM ) 5 %  Every 24 hours        05/01/24 1110             Note:  This document was prepared using Dragon voice recognition software and may include unintentional dictation errors.   Collis Deaner, MD 05/01/24 [phone]

## 2024-05-01 NOTE — ED Notes (Signed)
 Patient Alert and oriented to baseline. Stable and ambulatory to baseline. Patient verbalized understanding of the discharge instructions.  Patient belongings were taken by the patient.

## 2024-05-01 NOTE — Discharge Instructions (Addendum)
 Your evaluation in the emergency department was reassuring, and we saw no traumatic findings.  Please do follow-up with your primary care provider for reevaluation, and return to the emergency department with any new or worsening symptoms.  I prescribed you topical numbing patches to use as needed for any ongoing discomfort, and you can also use over-the-counter Tylenol  and Motrin .

## 2024-05-01 NOTE — ED Triage Notes (Signed)
 Pt bib ems from gas station after slipping on a puddle. Pt fell onto his left side. Complaining of pain right above R hip and neck pain. Pt did not hit head, no LOC. Not on anticoags.

## 2024-06-04 DIAGNOSIS — Z419 Encounter for procedure for purposes other than remedying health state, unspecified: Secondary | ICD-10-CM | POA: Diagnosis not present

## 2024-07-05 DIAGNOSIS — Z419 Encounter for procedure for purposes other than remedying health state, unspecified: Secondary | ICD-10-CM | POA: Diagnosis not present

## 2024-08-05 DIAGNOSIS — Z419 Encounter for procedure for purposes other than remedying health state, unspecified: Secondary | ICD-10-CM | POA: Diagnosis not present

## 2024-08-12 ENCOUNTER — Ambulatory Visit: Admitting: Nurse Practitioner

## 2024-09-04 DIAGNOSIS — Z419 Encounter for procedure for purposes other than remedying health state, unspecified: Secondary | ICD-10-CM | POA: Diagnosis not present
